# Patient Record
Sex: Male | Born: 2005
Health system: Southern US, Community
[De-identification: ages and names within clinical notes are randomized; demographics above are authoritative.]

## PROBLEM LIST (undated history)

## (undated) DIAGNOSIS — R569 Unspecified convulsions: Secondary | ICD-10-CM

## (undated) HISTORY — PX: CIRCUMCISION: SUR203

## (undated) HISTORY — DX: Unspecified convulsions: R56.9

---
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
## 2023-01-18 ENCOUNTER — Telehealth (INDEPENDENT_AMBULATORY_CARE_PROVIDER_SITE_OTHER): Payer: Self-pay | Admitting: Family

## 2023-01-18 NOTE — Telephone Encounter (Signed)
Patient not seen since 07/2021 and there are not any medications listed. Requested Meagan call and sched him for an appt.

## 2023-01-18 NOTE — Telephone Encounter (Signed)
  Name of who is calling: Lenise Arena Relationship to Patient: mom  Best contact number: [phone]  Provider they see: Rockwell Germany  Reason for call: Mom is calling saying the emergency medicine he had for seizures has expired. Do they even need to carry that around?   She is also sending over guardianship documentation.

## 2023-01-20 NOTE — Telephone Encounter (Signed)
I called and spoke with Colletta Maryland to get an appt scheduled for Roberto Williams,but she is requesting to speak with Otila Kluver regarding expired medication that she has on hand before an appt is made. She has requested a callback.

## 2023-01-20 NOTE — Telephone Encounter (Signed)
I called and talked with Mom. She said that the Midazolam has expired and that Acey hasn't had a seizure in 8 years. I told Mom that it is ok to not refill the medication unless she wants to keep it on hand. She prefers not to have it since he has been seizure free for so long. TG

## 2023-01-20 NOTE — Telephone Encounter (Signed)
Roberto Williams has called back in to follow up on emergency med. She has requested a call back  Guardianship papers are being resent to email.  FYI: I will be on the lookout for papers

## 2023-05-10 DIAGNOSIS — Z0000 Encounter for general adult medical examination without abnormal findings: Secondary | ICD-10-CM | POA: Diagnosis not present

## 2023-05-10 DIAGNOSIS — Z68.41 Body mass index (BMI) pediatric, 85th percentile to less than 95th percentile for age: Secondary | ICD-10-CM | POA: Diagnosis not present

## 2023-05-10 DIAGNOSIS — Z713 Dietary counseling and surveillance: Secondary | ICD-10-CM | POA: Diagnosis not present

## 2023-05-10 DIAGNOSIS — Z0001 Encounter for general adult medical examination with abnormal findings: Secondary | ICD-10-CM | POA: Diagnosis not present

## 2023-05-10 DIAGNOSIS — Z1331 Encounter for screening for depression: Secondary | ICD-10-CM | POA: Diagnosis not present

## 2023-06-08 DIAGNOSIS — M24531 Contracture, right wrist: Secondary | ICD-10-CM | POA: Diagnosis not present

## 2023-06-08 DIAGNOSIS — Z981 Arthrodesis status: Secondary | ICD-10-CM | POA: Diagnosis not present

## 2023-07-08 DIAGNOSIS — H468 Other optic neuritis: Secondary | ICD-10-CM | POA: Diagnosis not present

## 2023-07-08 DIAGNOSIS — H5509 Other forms of nystagmus: Secondary | ICD-10-CM | POA: Diagnosis not present

## 2023-07-08 DIAGNOSIS — H538 Other visual disturbances: Secondary | ICD-10-CM | POA: Diagnosis not present

## 2023-09-13 DIAGNOSIS — Q256 Stenosis of pulmonary artery: Secondary | ICD-10-CM | POA: Diagnosis not present

## 2023-09-13 DIAGNOSIS — Q203 Discordant ventriculoarterial connection: Secondary | ICD-10-CM | POA: Diagnosis not present

## 2023-11-02 DIAGNOSIS — G808 Other cerebral palsy: Secondary | ICD-10-CM | POA: Diagnosis not present

## 2023-11-26 DIAGNOSIS — J069 Acute upper respiratory infection, unspecified: Secondary | ICD-10-CM | POA: Diagnosis not present

## 2023-11-26 DIAGNOSIS — Z6828 Body mass index (BMI) 28.0-28.9, adult: Secondary | ICD-10-CM | POA: Diagnosis not present

## 2023-11-26 DIAGNOSIS — U071 COVID-19: Secondary | ICD-10-CM | POA: Diagnosis not present

## 2023-11-26 DIAGNOSIS — R509 Fever, unspecified: Secondary | ICD-10-CM | POA: Diagnosis not present

## 2023-12-02 DIAGNOSIS — Z6828 Body mass index (BMI) 28.0-28.9, adult: Secondary | ICD-10-CM | POA: Diagnosis not present

## 2023-12-02 DIAGNOSIS — U071 COVID-19: Secondary | ICD-10-CM | POA: Diagnosis not present

## 2023-12-02 DIAGNOSIS — J069 Acute upper respiratory infection, unspecified: Secondary | ICD-10-CM | POA: Diagnosis not present

## 2023-12-02 DIAGNOSIS — R030 Elevated blood-pressure reading, without diagnosis of hypertension: Secondary | ICD-10-CM | POA: Diagnosis not present

## 2024-05-20 ENCOUNTER — Emergency Department (HOSPITAL_COMMUNITY)

## 2024-05-20 ENCOUNTER — Other Ambulatory Visit: Payer: Self-pay

## 2024-05-20 ENCOUNTER — Emergency Department (HOSPITAL_COMMUNITY)
Admission: EM | Admit: 2024-05-20 | Discharge: 2024-05-20 | Disposition: A | Discharge status: Home / Self Care | Attending: Student | Admitting: Student

## 2024-05-20 ENCOUNTER — Encounter (HOSPITAL_COMMUNITY): Payer: Self-pay

## 2024-05-20 DIAGNOSIS — X58XXXA Exposure to other specified factors, initial encounter: Secondary | ICD-10-CM | POA: Diagnosis not present

## 2024-05-20 DIAGNOSIS — I10 Essential (primary) hypertension: Secondary | ICD-10-CM | POA: Diagnosis not present

## 2024-05-20 DIAGNOSIS — S0003XA Contusion of scalp, initial encounter: Secondary | ICD-10-CM | POA: Insufficient documentation

## 2024-05-20 DIAGNOSIS — G9389 Other specified disorders of brain: Secondary | ICD-10-CM | POA: Diagnosis not present

## 2024-05-20 DIAGNOSIS — G40209 Localization-related (focal) (partial) symptomatic epilepsy and epileptic syndromes with complex partial seizures, not intractable, without status epilepticus: Secondary | ICD-10-CM | POA: Diagnosis not present

## 2024-05-20 DIAGNOSIS — S0990XA Unspecified injury of head, initial encounter: Secondary | ICD-10-CM | POA: Diagnosis not present

## 2024-05-20 DIAGNOSIS — G40909 Epilepsy, unspecified, not intractable, without status epilepticus: Secondary | ICD-10-CM | POA: Insufficient documentation

## 2024-05-20 DIAGNOSIS — R569 Unspecified convulsions: Secondary | ICD-10-CM

## 2024-05-20 DIAGNOSIS — G4489 Other headache syndrome: Secondary | ICD-10-CM | POA: Diagnosis not present

## 2024-05-20 LAB — URINALYSIS, ROUTINE W REFLEX MICROSCOPIC
Bilirubin Urine: NEGATIVE
Glucose, UA: NEGATIVE mg/dL
Hgb urine dipstick: NEGATIVE
Ketones, ur: NEGATIVE mg/dL
Leukocytes,Ua: NEGATIVE
Nitrite: NEGATIVE
Protein, ur: >=300 mg/dL — AB
Specific Gravity, Urine: 1.016 (ref 1.005–1.030)
pH: 6 (ref 5.0–8.0)

## 2024-05-20 LAB — CBC WITH DIFFERENTIAL/PLATELET
Abs Immature Granulocytes: 0.01 10*3/uL (ref 0.00–0.07)
Basophils Absolute: 0.1 10*3/uL (ref 0.0–0.1)
Basophils Relative: 1 %
Eosinophils Absolute: 0.1 10*3/uL (ref 0.0–0.5)
Eosinophils Relative: 3 %
HCT: 45.7 % (ref 39.0–52.0)
Hemoglobin: 16 g/dL (ref 13.0–17.0)
Immature Granulocytes: 0 %
Lymphocytes Relative: 22 %
Lymphs Abs: 1.2 10*3/uL (ref 0.7–4.0)
MCH: 29.7 pg (ref 26.0–34.0)
MCHC: 35 g/dL (ref 30.0–36.0)
MCV: 84.9 fL (ref 80.0–100.0)
Monocytes Absolute: 0.5 10*3/uL (ref 0.1–1.0)
Monocytes Relative: 9 %
Neutro Abs: 3.6 10*3/uL (ref 1.7–7.7)
Neutrophils Relative %: 65 %
Platelets: 191 10*3/uL (ref 150–400)
RBC: 5.38 MIL/uL (ref 4.22–5.81)
RDW: 12.2 % (ref 11.5–15.5)
WBC: 5.5 10*3/uL (ref 4.0–10.5)
nRBC: 0 % (ref 0.0–0.2)

## 2024-05-20 LAB — RAPID URINE DRUG SCREEN, HOSP PERFORMED
Amphetamines: NOT DETECTED
Barbiturates: NOT DETECTED
Benzodiazepines: NOT DETECTED
Cocaine: NOT DETECTED
Opiates: NOT DETECTED
Tetrahydrocannabinol: NOT DETECTED

## 2024-05-20 LAB — COMPREHENSIVE METABOLIC PANEL WITH GFR
ALT: 34 U/L (ref 0–44)
AST: 25 U/L (ref 15–41)
Albumin: 4 g/dL (ref 3.5–5.0)
Alkaline Phosphatase: 64 U/L (ref 38–126)
Anion gap: 6 (ref 5–15)
BUN: 15 mg/dL (ref 6–20)
CO2: 28 mmol/L (ref 22–32)
Calcium: 9.3 mg/dL (ref 8.9–10.3)
Chloride: 106 mmol/L (ref 98–111)
Creatinine, Ser: 1.3 mg/dL — ABNORMAL HIGH (ref 0.61–1.24)
GFR, Estimated: >60 mL/min (ref >60)
Glucose, Bld: 121 mg/dL — ABNORMAL HIGH (ref 70–99)
Potassium: 3.9 mmol/L (ref 3.5–5.1)
Sodium: 140 mmol/L (ref 135–145)
Total Bilirubin: 0.7 mg/dL (ref 0.0–1.2)
Total Protein: 6.6 g/dL (ref 6.5–8.1)

## 2024-05-20 MED ORDER — LACTATED RINGERS IV BOLUS: 1000.0000 mL | Freq: Once | INTRAVENOUS | Status: DC

## 2024-05-20 MED ORDER — PROCHLORPERAZINE EDISYLATE 10 MG/2ML IJ SOLN: 10.0000 mg | Freq: Once | INTRAMUSCULAR | Status: DC

## 2024-05-20 MED ORDER — LEVETIRACETAM 500 MG PO TABS: 500.0000 mg | ORAL_TABLET | Freq: Two times a day (BID) | ORAL | 0 refills | Status: DC

## 2024-05-20 MED ORDER — ACETAMINOPHEN 500 MG PO TABS
1000.0000 mg | ORAL_TABLET | Freq: Once | ORAL | Status: AC
  Administered 2024-05-20: 1000 mg via ORAL
  Filled 2024-05-20: qty 2

## 2024-05-20 MED ORDER — ONDANSETRON HCL 4 MG/2ML IJ SOLN
INTRAMUSCULAR | Status: AC
  Filled 2024-05-20: qty 2

## 2024-05-20 MED ORDER — LEVETIRACETAM 500 MG PO TABS
500.0000 mg | ORAL_TABLET | Freq: Once | ORAL | Status: AC
  Administered 2024-05-20: 500 mg via ORAL
  Filled 2024-05-20: qty 1

## 2024-05-20 MED ORDER — DIPHENHYDRAMINE HCL 50 MG/ML IJ SOLN: 25.0000 mg | Freq: Once | INTRAMUSCULAR | Status: DC

## 2024-05-20 MED ORDER — ONDANSETRON HCL 4 MG/2ML IJ SOLN
4.0000 mg | Freq: Once | INTRAMUSCULAR | Status: AC
  Administered 2024-05-20: 4 mg via INTRAVENOUS
  Filled 2024-05-20: qty 2

## 2024-05-20 NOTE — ED Notes (Signed)
 Pt vomited after returning from ct  Pt changed into a gown and bed is cleaned

## 2024-05-20 NOTE — ED Triage Notes (Signed)
 Pt BIB [facility] from home after a witnessed grand mal seizure lasting about 3 minutes. Pt has remote hx seizures but none in the past 9 years. Hematoma to posterior scalp.  120/70 88HR 98% 14RR 112cbg 20g LAC

## 2024-05-20 NOTE — ED Provider Notes (Signed)
 [place] EMERGENCY DEPARTMENT AT [facility] Provider Note  CSN: [id number] Arrival date & time: 05/20/24 1649  Chief Complaint(s) Seizures  HPI Roberto Williams is a 18 y.o. 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  Patient was working out on the elliptical in the garage when his mother heard a crash.  Found the patient on the ground with involuntary contractures of the right upper extremity and a right sided gaze which is typical for his normal seizure pattern previously.  Return to normal mental status baseline within 5 minutes and no abortive medications used.  Here in the emergency room, he is asymptomatic denying any chest pain, shortness of breath, abdominal pain, nausea, vomiting, numbness, tingling, weakness outside of normal.   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 Authorizing Provider  levETIRAcetam (KEPPRA) 500 MG tablet Take 1 tablet (500 mg total) by mouth 2 (two) times daily. 05/20/24 06/19/24 Yes Kyngston Pickelsimer,  Coby Shrewsberry, MD                                                                                                                                    Past Surgical History Past Surgical History:  Procedure Laterality Date   CIRCUMCISION  2005   OTHER SURGICAL HISTORY  04/07/11   Right Heel Cord Surgery   OTHER SURGICAL HISTORY  03/14/04   Repair of transposition of the great arteries 2005,Right Heel Cord Surgery April 07, 2011   Family History Family History  Problem Relation Age of Onset   Heart attack Maternal Grandfather    Diabetes Paternal Grandmother     Social History Social History   Tobacco Use   Smoking status: Never   Smokeless tobacco: Never  Substance Use Topics   Alcohol use: Not Currently   Drug use: Never   Allergies Patient has no known allergies.  Review of Systems Review of Systems  Neurological:  Positive for seizures.    Physical Exam Vital Signs  I have reviewed the triage vital signs BP 128/70   Pulse 82  Temp 98 F (36.7 C) (Oral)   Resp 15   Ht 5\' 11"  (1.803 m)   Wt 77.1 kg   SpO2 100%   BMI 23.71 kg/m   Physical Exam Constitutional:      General: He is not in acute distress.    Appearance: Normal appearance.  HENT:     Head: Normocephalic.     Comments: Small posterior scalp hematoma    Nose: No congestion or rhinorrhea.  Eyes:     General:        Right eye: No discharge.        Left eye: No discharge.     Extraocular Movements: Extraocular movements intact.     Pupils: Pupils are equal, round, and reactive to light.  Cardiovascular:     Rate and Rhythm: Normal rate and regular rhythm.     Heart sounds: Murmur heard.  Pulmonary:     Effort: No respiratory distress.     Breath sounds: No wheezing or rales.  Abdominal:     General: There is no distension.     Tenderness: There is no abdominal tenderness.  Musculoskeletal:        General: Normal range of motion.     Cervical back: Normal range of motion.  Skin:    General:  Skin is warm and dry.  Neurological:     General: No focal deficit present.     Mental Status: He is alert. Mental status is at baseline.     Motor: Weakness present.     ED Results and Treatments Labs (all labs ordered are listed, but only abnormal results are displayed) Labs Reviewed  COMPREHENSIVE METABOLIC PANEL WITH GFR - Abnormal; Notable for the following components:      Result Value   Glucose, Bld 121 (*)    Creatinine, Ser 1.30 (*)    All other components within normal limits  URINALYSIS, ROUTINE W REFLEX MICROSCOPIC - Abnormal; Notable for the following components:   Protein, ur >=300 (*)    Bacteria, UA RARE (*)    All other components within normal limits  CBC WITH DIFFERENTIAL/PLATELET  RAPID URINE DRUG SCREEN, HOSP PERFORMED                                                                                                                          Radiology CT Head Wo Contrast Result Date: 05/20/2024 CLINICAL DATA:  Seizure disorder EXAM: CT HEAD WITHOUT CONTRAST TECHNIQUE: Contiguous axial images were obtained from the base of the skull through the vertex without intravenous contrast. RADIATION DOSE REDUCTION: This exam was performed according to the departmental dose-optimization program which includes automated exposure control, adjustment of the mA and/or kV according to patient size and/or use of iterative reconstruction technique. COMPARISON:  03/06/2007 FINDINGS: Brain: Stable left frontotemporal encephalomalacia, with ex vacuo dilatation of the left lateral ventricle. No evidence of acute infarct or hemorrhage. Lateral ventricles and midline structures are otherwise unremarkable. No acute extra-axial fluid collections.  No mass effect. Vascular: No hyperdense vessel or unexpected calcification. Skull: Normal. Negative for fracture or focal lesion. Sinuses/Orbits: No acute finding. Other: None. IMPRESSION: 1. Stable head CT, no acute intracranial process. Electronically  Signed   By: Bobbye Burrow M.D.   On: 05/20/2024 18:04    Pertinent labs & imaging results that were available during my care of the patient were reviewed by me and considered in my medical decision making (see MDM for details).  Medications Ordered in ED Medications  levETIRAcetam (KEPPRA) tablet 500 mg (has no administration in time range)  ondansetron  (ZOFRAN ) injection 4 mg (4 mg Intravenous Given 05/20/24 1808)  acetaminophen (TYLENOL) tablet 1,000 mg (1,000 mg Oral Given 05/20/24 1844)                                                                                                                                     Procedures Procedures  (including critical care time)  Medical Decision Making / ED Course   This patient presents to the ED for concern of seizure, this involves an extensive number of treatment options, and is a complaint that carries with it a high risk of complications and morbidity.  The differential diagnosis includes medication noncompliance, meningitis, posterior reversible encephalopathy syndrome, hyponatremia, convulsive syncope, focal lesion/mass, head trauma, intracerebral hemorrhage, toxins/recreational drugs, pseudoseizure  MDM: Patient seen the emergency room for evaluation of a seizure.  Physical exam with baseline right upper and right lower extremity weakness but no new neurologic findings today.  No cranial nerve deficits.  Laboratory evaluation with a creatinine of 1.3 with unknown baseline as last creatinine available is 1 from 2019.  Urinalysis with proteinuria but is otherwise unremarkable with no evidence of infection.  UDS negative.  CT head reassuringly stable with no acute findings.  Patient has returned to normal mental status baseline and throughout his time in the emergency department did not have any change in mental status or repeat seizure activity.  He did have a headache with 1 episode of vomiting and symptoms improved after Zofran  and  Tylenol.  I spoke with the neurologist on-call Dr. Cleone Dad who is recommending reinitiating AEDs with outpatient neurology follow-up and no need for MRI in the ER today as seizure morphology was identical to previous seizures.  Patient was previously on Lamictal  and given timeframe to reach therapeutic levels, neurology recommending Keppra initiation today.  Patient will be started on Keppra 500 twice daily and I placed an outpatient referral to [facility] neurology.  At this time he does not meet inpatient criteria for admission and will be discharged with outpatient follow-up.   Additional history obtained: -Additional history obtained from mother -External records from outside source obtained and reviewed including: Chart review including previous notes, labs, imaging, consultation notes   Lab Tests: -I ordered, reviewed, and interpreted labs.   The pertinent results include:   Labs Reviewed  COMPREHENSIVE METABOLIC PANEL WITH GFR -  Abnormal; Notable for the following components:      Result Value   Glucose, Bld 121 (*)    Creatinine, Ser 1.30 (*)    All other components within normal limits  URINALYSIS, ROUTINE W REFLEX MICROSCOPIC - Abnormal; Notable for the following components:   Protein, ur >=300 (*)    Bacteria, UA RARE (*)    All other components within normal limits  CBC WITH DIFFERENTIAL/PLATELET  RAPID URINE DRUG SCREEN, HOSP PERFORMED        Imaging Studies ordered: I ordered imaging studies including CT head I independently visualized and interpreted imaging. I agree with the radiologist interpretation   Medicines ordered and prescription drug management: Meds ordered this encounter  Medications   ondansetron  (ZOFRAN ) injection 4 mg   DISCONTD: prochlorperazine (COMPAZINE) injection 10 mg   DISCONTD: diphenhydrAMINE (BENADRYL) injection 25 mg   DISCONTD: lactated ringers bolus 1,000 mL   acetaminophen (TYLENOL) tablet 1,000 mg   DISCONTD: ondansetron  (ZOFRAN ) 4  MG/2ML injection    Martina Sledge, Dylan L: cabinet override   levETIRAcetam (KEPPRA) tablet 500 mg   levETIRAcetam (KEPPRA) 500 MG tablet    Sig: Take 1 tablet (500 mg total) by mouth 2 (two) times daily.    Dispense:  60 tablet    Refill:  0    -I have reviewed the patients home medicines and have made adjustments as needed  Critical interventions none  Consultations Obtained: I requested consultation with the neurologist on-call,  and discussed lab and imaging findings as well as pertinent plan - they recommend: Keppra, outpatient follow-up   Cardiac Monitoring: The patient was maintained on a cardiac monitor.  I personally viewed and interpreted the cardiac monitored which showed an underlying rhythm of: NSR  Social Determinants of Health:  Factors impacting patients care include: none   Reevaluation: After the interventions noted above, I reevaluated the patient and found that they have :improved  Co morbidities that complicate the patient evaluation  Past Medical History:  Diagnosis Date   Seizures (HCC)       Dispostion: I considered admission for this patient, but at this time he does not meet inpatient criteria for admission will be discharged with outpatient follow-up     Final Clinical Impression(s) / ED Diagnoses Final diagnoses:  Seizure [facility])     @PCDICTATION @    Karlyn Overman, MD 05/20/24 [id number]

## 2024-05-20 NOTE — ED Notes (Signed)
Seizure pads placed on both bed rails ?

## 2024-05-20 NOTE — Progress Notes (Addendum)
 Discussed with Dr. 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 Darlys Eland).  He was very well-controlled on lamotrigine .  Eventually this was tapered as he was not having any seizures, and he continued to be seizure-free for multiple years until today  CT head is reassuring without acute intracranial process and patient is back to his baseline per ED provider  I recommend reinitiation of lamotrigine .  Given that this takes several months to reach therapeutic level, I also recommend bridge with Keppra 500 mg twice daily for 2 months  Lamotrigine  titration schedule:   Morning  Night  Week 1 and 2   none   25 mg   Week 3 and 4   none   50 mg  Week 5   25 mg   50 mg  Week 6   50 mg   75 mg  Week 7   75 mg 100 mg  Week 8  100 mg 125 mg   Alternatively, Keppra monotherapy is also reasonable   Outpatient referral to [facility] is appropriate  Baldwin Levee MD-PhD [facility] [phone] Available 7 AM to 7 PM, outside these hours please contact Neurologist on call listed on AMION  11 minutes spent in care of the patient, majority in discussion with Dr. Jeannie Milo

## 2024-05-24 ENCOUNTER — Encounter: Payer: Self-pay | Admitting: Neurology

## 2024-05-24 ENCOUNTER — Ambulatory Visit (INDEPENDENT_AMBULATORY_CARE_PROVIDER_SITE_OTHER): Admitting: Neurology

## 2024-05-24 VITALS — BP 129/84 | HR 72 | Ht 71.0 in | Wt 195.0 lb

## 2024-05-24 DIAGNOSIS — G40209 Localization-related (focal) (partial) symptomatic epilepsy and epileptic syndromes with complex partial seizures, not intractable, without status epilepticus: Secondary | ICD-10-CM

## 2024-05-24 MED ORDER — VALTOCO 15 MG DOSE 2 X 7.5 MG/0.1ML NA LQPK: 15.0000 mg | NASAL | 0 refills | Status: AC | PRN

## 2024-05-24 MED ORDER — LEVETIRACETAM 500 MG PO TABS: 500.0000 mg | ORAL_TABLET | Freq: Two times a day (BID) | ORAL | 3 refills | Status: DC

## 2024-05-24 NOTE — Patient Instructions (Signed)
 Continue with Keppra 500 mg twice daily, refill given Will give patient Valtoco  as rescue medication Will check Keppra level today Routine EEG Follow-up in 6 months or sooner if worse.

## 2024-05-24 NOTE — Progress Notes (Signed)
 [facility]  PATIENT: Roberto Williams DOB: [birth year]  REQUESTING CLINICIAN: Kommor, Madison, MD HISTORY FROM: Patient/Mother and chart review  REASON FOR VISIT: Seizure disorder    HISTORICAL  CHIEF COMPLAINT:  Chief Complaint  Patient presents with   New Patient (Initial Visit)    Rm 13. NP internal referral for Seizure. Pt reports having seizures 20 years old - around 18 years old. Had his first seizure in 9 years last Saturday. Has not been taking meds for sz in 7 years, until after sz on Saturday. Pt reports working out right before sz on Saturday, but reports working out every day so does not think that is the trigger.      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 In term of the seizures, they started at the age of 1, described as generalized seizure with head and eyes deviation to the right and contraction of the right arm.  He was initially on carbamazepine  but switched to lamotrigine .  At the age of 26 he stopped having seizure but was still on medication for another 2 years.  At the age of 34 medication was discontinued after his EEG was normal. He has been off medication since then, for total of 7 years but on May 24, he did have a breakthrough seizure while working out in the garage, this was a his typical seizure, there was no injury, no tongue biting and no urinary incontinence.  Patient presented to the hospital and started on Keppra 500 mg twice daily.  He has been taking the Keppra for the past 3 to 4 days, denies any side effect from the medication.  Currently he is back to his normal self.   Handedness: left handed   Onset: At age 58  Seizure Type: Focal to bilateral tonic clonic  seizures  Current frequency: Last seizure 5/24, prior to that was 9 years ago   Any injuries from seizures: Denies   Seizure risk factors: Stroke at that age of 37 days old during  cardiac procedure   Previous ASMs: Carbamazepine /Lamotrigine    Currenty ASMs: Levetiracetam 500 mg twice daily   ASMs side effects: Denies   Brain Images: Left frontotemporal encephalomalacia   Previous EEGs: Last EEG reported normal    OTHER MEDICAL CONDITIONS: Left hemispheric stroke with right hemiplegia, focal epilepsy  REVIEW OF SYSTEMS: Full 14 system review of systems performed and negative with exception of: As noted in the HPI   ALLERGIES: No Known Allergies  HOME MEDICATIONS: Outpatient Medications Prior to Visit  Medication Sig Dispense Refill   levETIRAcetam (KEPPRA) 500 MG tablet Take 1 tablet (500 mg total) by mouth 2 (two) times daily. 60 tablet 0   No facility-administered medications prior to visit.    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 2005,Right Heel Cord Surgery April 07, 2011    FAMILY HISTORY: Family History  Problem Relation Age of  Onset   Heart attack Maternal Grandfather    Diabetes Paternal Grandmother     SOCIAL HISTORY: Social History   Socioeconomic History   Marital status: Single    Spouse name: Not on file   Number of children: Not on file   Years of education: Not on file   Highest education level: Not on file  Occupational History   Not on file  Tobacco Use   Smoking status: Never   Smokeless tobacco: Never  Substance and Sexual Activity   Alcohol use: Not Currently   Drug use: Never   Sexual activity: Not on file  Other Topics Concern   Not on file   Social History Narrative   [name] is a 12th grade student   He is home schooled this year.    He lives with his parents and two siblings.    He enjoys playing baseball for the [employer] and playing outside.     Social Drivers of Corporate investment banker Strain: Not on file  Food Insecurity: Not on file  Transportation Needs: Not on file  Physical Activity: Not on file  Stress: Not on file  Social Connections: Not on file  Intimate Partner Violence: Not on file    PHYSICAL EXAM  GENERAL EXAM/CONSTITUTIONAL: Vitals:  Vitals:   05/24/24 1107  BP: 129/84  Pulse: 72  Weight: 195 lb (88.5 kg)  Height: 5\' 11"  (1.803 m)   Body mass index is 27.2 kg/m. Wt Readings from Last 3 Encounters:  05/24/24 195 lb (88.5 kg)  05/20/24 170 lb (77.1 kg)  08/14/21 190 lb 9.6 oz (86.5 kg) (93%, Z= 1.46)*   * Growth percentiles are based on CDC (Boys, 2-20 Years) data.   Patient is in no distress; well developed, nourished and groomed; neck is supple  MUSCULOSKELETAL: Gait, strength, tone, movements noted in Neurologic exam below  NEUROLOGIC: MENTAL STATUS:      No data to display         awake, alert, oriented to person, place and time recent and remote memory intact normal attention and concentration language fluent, comprehension intact, naming intact fund of knowledge appropriate  CRANIAL NERVE:  2nd, 3rd, 4th, 6th - Visual fields full to confrontation, extraocular muscles intact, no nystagmus 5th - facial sensation symmetric 7th - facial strength symmetric 8th - hearing intact 9th - palate elevates symmetrically, uvula midline 11th - shoulder shrug symmetric 12th - tongue protrusion midline  MOTOR:  normal bulk and tone, full strength in the LUE and LLE. RUE and RLE 4 to 4+/5 except for 1 to 2 finger intrinsics. He does have a right AFO  SENSORY:  normal and symmetric to light touch  COORDINATION:  finger-nose-finger, fine finger movements normal on the  left   GAIT/STATION:  Hemiplegic gait    DIAGNOSTIC DATA (LABS, IMAGING, TESTING) - I reviewed patient records, labs, notes, testing and imaging myself where available.  Lab Results  Component Value Date   WBC 5.5 05/20/2024   HGB 16.0 05/20/2024   HCT 45.7 05/20/2024   MCV 84.9 05/20/2024   PLT 191 05/20/2024      Component Value Date/Time   NA 140 05/20/2024 1700   K 3.9 05/20/2024 1700   CL 106 05/20/2024 1700   CO2 28 05/20/2024 1700   GLUCOSE 121 (H) 05/20/2024 1700   BUN 15 05/20/2024 1700   CREATININE 1.30 (H) 05/20/2024 1700   CALCIUM 9.3 05/20/2024 1700   PROT 6.6 05/20/2024 1700   ALBUMIN  4.0 05/20/2024 1700   AST 25 05/20/2024 1700   ALT 34 05/20/2024 1700   ALKPHOS 64 05/20/2024 1700   BILITOT 0.7 05/20/2024 1700   GFRNONAA >60 05/20/2024 1700   No results found for: "CHOL", "HDL", "LDLCALC", "LDLDIRECT", "TRIG" No results found for: "HGBA1C" No results found for: "VITAMINB12" No results found for: "TSH"  Head CT 04/20/2024 Stable left frontotemporal encephalomalacia, with ex vacuo dilatation of the left lateral ventricle. No evidence of acute infarct or hemorrhage.  EEG 2017 This is a normal record with the patient awake and drowsy.  The infrequent asymmetry in background during drowsiness is of uncertain significance likely relates to the patient's underlying structural abnormality.  There is no clear-cut seizure activity except for the solitary lesion described above.  This is not epileptogenic from an electrographic viewpoint.    ASSESSMENT AND PLAN  18 y.o. year old male  with history of stroke at the age of 2 days old during a cardiac procedure, seizure disorder who is presenting for management of his epilepsy.  He did have a seizure on May 24 but prior to that his last seizure was 9 years ago.  He is back on antiseizure medication, Keppra  500 mg twice daily.  Plan will be for patient to continue Keppra  500 mg twice daily and will obtain a Keppra   level and a routine EEG.  Again family is interested in coming off medication but I did inform them that he will need to remain on medication and be seizure-free for few years before we can reduce the medication.  Advised him to contact me if patient experience any side effect from the Keppra .  I will see him in 6 months for follow-up or sooner if worse.   1. Partial symptomatic epilepsy with complex partial seizures, not intractable, without status epilepticus (HCC)     Patient Instructions  Continue with Keppra  500 mg twice daily, refill given Will give patient Valtoco  as rescue medication Will check Keppra  level today Routine EEG Follow-up in 6 months or sooner if worse.   Per [place] statutes, patients with seizures are not allowed to drive until they have been seizure-free for six months.  Other recommendations include using caution when using heavy equipment or power tools. Avoid working on ladders or at heights. Take showers instead of baths.  Do not swim alone.  Ensure the water temperature is not too high on the home water heater. Do not go swimming alone. Do not lock yourself in a room alone (i.e. bathroom). When caring for infants or small children, sit down when holding, feeding, or changing them to minimize risk of injury to the child in the event you have a seizure. Maintain good sleep hygiene. Avoid alcohol.  Also recommend adequate sleep, hydration, good diet and minimize stress.   During the Seizure  - First, ensure adequate ventilation and place patients on the floor on their left side  Loosen clothing around the neck and ensure the airway is patent. If the patient is clenching the teeth, do not force the mouth open with any object as this can cause severe damage - Remove all items from the surrounding that can be hazardous. The patient may be oblivious to what's happening and may not even know what he or she is doing. If the patient is confused and wandering,  either gently guide him/her away and block access to outside areas - Reassure the individual and be comforting - Call 911. In most cases, the seizure ends  before EMS arrives. However, there are cases when seizures may last over 3 to 5 minutes. Or the individual may have developed breathing difficulties or severe injuries. If a pregnant patient or a person with diabetes develops a seizure, it is prudent to call an ambulance. - Finally, if the patient does not regain full consciousness, then call EMS. Most patients will remain confused for about 45 to 90 minutes after a seizure, so you must use judgment in calling for help. - Avoid restraints but make sure the patient is in a bed with padded side rails - Place the individual in a lateral position with the neck slightly flexed; this will help the saliva drain from the mouth and prevent the tongue from falling backward - Remove all nearby furniture and other hazards from the area - Provide verbal assurance as the individual is regaining consciousness - Provide the patient with privacy if possible - Call for help and start treatment as ordered by the caregiver   After the Seizure (Postictal Stage)  After a seizure, most patients experience confusion, fatigue, muscle pain and/or a headache. Thus, one should permit the individual to sleep. For the next few days, reassurance is essential. Being calm and helping reorient the person is also of importance.  Most seizures are painless and end spontaneously. Seizures are not harmful to others but can lead to complications such as stress on the lungs, brain and the heart. Individuals with prior lung problems may develop labored breathing and respiratory distress.    Discussed Patients with epilepsy have a small risk of sudden unexpected death, a condition referred to as sudden unexpected death in epilepsy (SUDEP). SUDEP is defined specifically as the sudden, unexpected, witnessed or unwitnessed, nontraumatic and  nondrowning death in patients with epilepsy with or without evidence for a seizure, and excluding documented status epilepticus, in which post mortem examination does not reveal a structural or toxicologic cause for death     Orders Placed This Encounter  Procedures   Levetiracetam  level   EEG adult    Meds ordered this encounter  Medications   levETIRAcetam  (KEPPRA ) 500 MG tablet    Sig: Take 1 tablet (500 mg total) by mouth 2 (two) times daily.    Dispense:  180 tablet    Refill:  3   diazePAM , 15 MG Dose, (VALTOCO  15 MG DOSE) 2 x 7.5 MG/0.1ML LQPK    Sig: Place 15 mg into the nose as needed (for seizure lasting more than 2 minutes).    Dispense:  3 each    Refill:  0    Please provide 3 boxes for a total of 6 doses    Return in about 6 months (around 11/24/2024).    Cassandra Cleveland, MD 05/24/2024, 12:44 PM  [facility] [address] [place], [place] [address] [phone]

## 2024-05-26 LAB — LEVETIRACETAM LEVEL: Levetiracetam Lvl: 20.3 ug/mL (ref 10.0–40.0)

## 2024-05-29 ENCOUNTER — Ambulatory Visit: Payer: Self-pay | Admitting: Neurology

## 2024-05-31 ENCOUNTER — Ambulatory Visit (INDEPENDENT_AMBULATORY_CARE_PROVIDER_SITE_OTHER): Admitting: Neurology

## 2024-05-31 DIAGNOSIS — G40209 Localization-related (focal) (partial) symptomatic epilepsy and epileptic syndromes with complex partial seizures, not intractable, without status epilepticus: Secondary | ICD-10-CM

## 2024-05-31 NOTE — Procedures (Signed)
    History:  18 year old man with epilepsy  EEG classification: Awake and drowsy  Duration: 27 minutes   Technical aspects: This EEG study was done with scalp electrodes positioned according to the 10-20 International system of electrode placement. Electrical activity was reviewed with band pass filter of 1-70Hz , sensitivity of 7 uV/mm, display speed of 56mm/sec with a 60Hz  notched filter applied as appropriate. EEG data were recorded continuously and digitally stored.   Description of the recording: The background rhythms of this recording consists of a fairly well modulated medium amplitude alpha rhythm of 10 Hz that is reactive to eye opening and closure. Present in the anterior head region is a 15-20 Hz beta activity. Photic stimulation was performed, did not show any abnormalities. Hyperventilation was also performed, did not show any abnormalities. Drowsiness was not seen. No abnormal epileptiform discharges seen during this recording. There was left hemispheric slowing. There were no electrographic seizure identified.   Abnormality: Left hemispheric slowing   Impression: This is an abnormal awake EEG due to left hemispheric slowing. This is consistent with neuronal dysfunction in the left hemisphere. No seizures, or epileptiform discharges seen during this recording.       Markelle Asaro, MD [facility]

## 2024-06-12 ENCOUNTER — Telehealth: Payer: Self-pay | Admitting: Neurology

## 2024-06-12 MED ORDER — LEVETIRACETAM 500 MG PO TABS: 500.0000 mg | ORAL_TABLET | Freq: Two times a day (BID) | ORAL | 3 refills | Status: DC

## 2024-06-12 NOTE — Telephone Encounter (Signed)
 Pt mother called in regards to getting refill of medication ( levETIRAcetam  (KEPPRA ) 500 MG tablet). Pt has a week left of medication .  Pt mother would like medication sent to   [facility] [address] [place], [place] - [address] (Ph: [phone])

## 2024-06-12 NOTE — Telephone Encounter (Signed)
 refilled

## 2024-07-12 ENCOUNTER — Encounter: Payer: Self-pay | Admitting: Neurology

## 2024-07-12 NOTE — Telephone Encounter (Signed)
 Let me review the forms.

## 2024-07-13 DIAGNOSIS — H538 Other visual disturbances: Secondary | ICD-10-CM | POA: Diagnosis not present

## 2024-07-20 DIAGNOSIS — Z1329 Encounter for screening for other suspected endocrine disorder: Secondary | ICD-10-CM | POA: Diagnosis not present

## 2024-07-20 DIAGNOSIS — Z0001 Encounter for general adult medical examination with abnormal findings: Secondary | ICD-10-CM | POA: Diagnosis not present

## 2024-07-20 DIAGNOSIS — Z136 Encounter for screening for cardiovascular disorders: Secondary | ICD-10-CM | POA: Diagnosis not present

## 2024-07-20 DIAGNOSIS — Z131 Encounter for screening for diabetes mellitus: Secondary | ICD-10-CM | POA: Diagnosis not present

## 2024-08-02 DIAGNOSIS — G802 Spastic hemiplegic cerebral palsy: Secondary | ICD-10-CM | POA: Diagnosis not present

## 2024-08-03 DIAGNOSIS — D751 Secondary polycythemia: Secondary | ICD-10-CM | POA: Diagnosis not present

## 2024-08-03 DIAGNOSIS — E781 Pure hyperglyceridemia: Secondary | ICD-10-CM | POA: Diagnosis not present

## 2024-09-07 DIAGNOSIS — D751 Secondary polycythemia: Secondary | ICD-10-CM | POA: Diagnosis not present

## 2024-09-07 DIAGNOSIS — E781 Pure hyperglyceridemia: Secondary | ICD-10-CM | POA: Diagnosis not present

## 2024-09-14 DIAGNOSIS — R569 Unspecified convulsions: Secondary | ICD-10-CM | POA: Diagnosis not present

## 2024-09-14 DIAGNOSIS — G43009 Migraine without aura, not intractable, without status migrainosus: Secondary | ICD-10-CM | POA: Diagnosis not present

## 2024-09-14 DIAGNOSIS — R809 Proteinuria, unspecified: Secondary | ICD-10-CM | POA: Diagnosis not present

## 2024-09-14 DIAGNOSIS — D751 Secondary polycythemia: Secondary | ICD-10-CM | POA: Diagnosis not present

## 2024-11-21 DIAGNOSIS — G40909 Epilepsy, unspecified, not intractable, without status epilepticus: Secondary | ICD-10-CM | POA: Diagnosis not present

## 2024-11-21 DIAGNOSIS — J029 Acute pharyngitis, unspecified: Secondary | ICD-10-CM | POA: Diagnosis not present

## 2024-11-21 DIAGNOSIS — R509 Fever, unspecified: Secondary | ICD-10-CM | POA: Diagnosis not present

## 2024-12-12 ENCOUNTER — Encounter: Payer: Self-pay | Admitting: Neurology

## 2024-12-12 ENCOUNTER — Ambulatory Visit: Admitting: Neurology

## 2024-12-12 VITALS — HR 67 | Temp 97.9°F | Ht 71.0 in | Wt 181.0 lb

## 2024-12-12 DIAGNOSIS — G40209 Localization-related (focal) (partial) symptomatic epilepsy and epileptic syndromes with complex partial seizures, not intractable, without status epilepticus: Secondary | ICD-10-CM

## 2024-12-12 MED ORDER — LEVETIRACETAM 500 MG PO TABS: 500.0000 mg | ORAL_TABLET | Freq: Two times a day (BID) | ORAL | 4 refills | Status: AC

## 2024-12-12 NOTE — Patient Instructions (Addendum)
 Continue with Keppra  500 mg twice daily At next visit, if no additional seizures, we will reduce it to 50 mg twice daily Continue to follow with your doctor Return in 1 year or sooner if worse.

## 2024-12-12 NOTE — Progress Notes (Signed)
 [facility]  PATIENT: Roberto Williams DOB: Jun 23, 2006  REQUESTING CLINICIAN: No ref. provider found HISTORY FROM: Patient/Mother and chart review  REASON FOR VISIT: Seizure disorder    HISTORICAL  CHIEF COMPLAINT:  Chief Complaint  Patient presents with   Follow-up    Follow-up seizures. Patient is in room number 13.    INTERVAL HISTORY 12/12/2024 Patient presents today, he is accompanied by mother.  Last visit was in June, at that time we continued his Keppra  500 mg twice daily, obtained a routine EEG which showed a continuous left temporal slowing.  He reports compliance with the medications, no side effects, and no additional seizures.  Since June, he did have 3 headaches lasting about 4 hours.  Usually does not take medication with his headaches.  No other complaints, no other concerns.  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 In term of the seizures, they started at the age of 1, described as generalized seizure with head and eyes deviation to the right and contraction of the right arm.  He was initially on carbamazepine  but switched to lamotrigine .  At the age of 18 he stopped having seizure but was still on medication for another 2 years.  At the age of 18 medication was discontinued after his EEG was normal. He has been off medication since then, for total of 7 years but on May 24, he did have a breakthrough seizure while working out in the garage, this was a his typical seizure, there was no injury, no tongue biting and no urinary incontinence.  Patient presented to the hospital and started on Keppra  500 mg twice daily.  He has been taking  the Keppra  for the past 3 to 4 days, denies any side effect from the medication.  Currently he is back to his normal self.   Handedness: left handed   Onset: At age 18  Seizure Type: Focal to bilateral tonic clonic seizures  Current frequency: Last seizure 5/24, prior to that was 9 years ago   Any injuries from seizures: Denies   Seizure risk factors: Stroke at that age of 20 days old during  cardiac procedure   Previous ASMs: Carbamazepine /Lamotrigine    Currenty ASMs: Levetiracetam  500 mg twice daily   ASMs side effects: Denies   Brain Images: Left frontotemporal encephalomalacia   Previous EEGs: Last EEG reported normal    OTHER MEDICAL CONDITIONS: Left hemispheric stroke with right hemiplegia, focal epilepsy  REVIEW OF SYSTEMS: Full 14 system review of systems performed and negative with exception of: As noted in the HPI   ALLERGIES: No Known Allergies  HOME MEDICATIONS: Outpatient Medications Prior to Visit  Medication Sig Dispense Refill   diazePAM , 15 MG Dose, (VALTOCO  15 MG DOSE) 2 x 7.5 MG/0.1ML LQPK Place 15 mg into the nose as needed (for seizure lasting more than 2 minutes). 3 each 0   levETIRAcetam  (KEPPRA ) 500 MG tablet Take 1 tablet (500 mg total) by mouth 2 (two) times daily. 180 tablet 3   No facility-administered medications prior to visit.    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 2005,Right Heel Cord Surgery April 07, 2011    FAMILY HISTORY: Family History  Problem Relation Age of Onset   Heart attack Maternal Grandfather    Diabetes Paternal Grandmother     SOCIAL HISTORY: Social History   Socioeconomic History   Marital status: Single    Spouse name: Not  on file   Number of children: Not on file   Years of education: Not on file   Highest education level: Not on file  Occupational History   Not on file  Tobacco Use   Smoking status: Never   Smokeless tobacco: Never  Substance and Sexual Activity   Alcohol use: Not Currently   Drug use: Never   Sexual activity: Not on file  Other Topics Concern   Not on file  Social History Narrative   Kalonji is a 12th grade student   He is home schooled this year.    He lives with his parents and two siblings.    He enjoys playing baseball for the [employer] and playing outside.     Social Drivers of Health   Tobacco Use: Low Risk (12/12/2024)   Patient History    Smoking Tobacco Use: Never    Smokeless Tobacco Use: Never    Passive Exposure: Not on file  Financial Resource Strain: Not on file  Food Insecurity: Not on file  Transportation Needs: Not on file  Physical Activity: Not on file  Stress: Not on file  Social Connections: Not on file  Intimate Partner Violence: Not on file  Depression (EYV7-0): Not on file  Alcohol Screen: Not on file  Housing: Not on file  Utilities: Not on file  Health Literacy: Not on file    PHYSICAL EXAM  GENERAL EXAM/CONSTITUTIONAL: Vitals:  Vitals:   12/12/24 1332  Pulse: 67  Temp: 97.9 F (36.6 C)  SpO2: 99%  Weight: 181 lb (82.1 kg)  Height: 5' 11 (1.803 m)   Body mass index is 25.24 kg/m. Wt Readings from Last 3 Encounters:  12/12/24 181 lb (82.1 kg)  05/24/24 195 lb (88.5 kg)  05/20/24 170 lb (77.1 kg)   Patient is in no distress; well developed, nourished and groomed; neck is supple  MUSCULOSKELETAL: Gait, strength, tone, movements noted in Neurologic exam below  NEUROLOGIC: MENTAL STATUS:      No data to display         awake, alert, oriented to person, place and time recent and remote memory intact normal attention and concentration language fluent, comprehension intact, naming intact fund of knowledge  appropriate  CRANIAL NERVE:  2nd, 3rd, 4th, 6th - Visual fields full to confrontation, extraocular muscles intact, no nystagmus 5th - facial sensation symmetric 7th - facial strength symmetric 8th - hearing intact 9th - palate elevates symmetrically, uvula midline 11th - shoulder shrug symmetric 12th - tongue protrusion midline  MOTOR:  normal bulk and tone, full strength in the LUE and LLE. RUE and RLE 4 to 4+/5 except for 1 to 2 finger intrinsics. He does have a right AFO  SENSORY:  normal and symmetric to light touch  COORDINATION:  finger-nose-finger, fine finger movements normal on the left   GAIT/STATION:  Hemiplegic gait    DIAGNOSTIC DATA (LABS, IMAGING, TESTING) -  I reviewed patient records, labs, notes, testing and imaging myself where available.  Lab Results  Component Value Date   WBC 5.5 05/20/2024   HGB 16.0 05/20/2024   HCT 45.7 05/20/2024   MCV 84.9 05/20/2024   PLT 191 05/20/2024      Component Value Date/Time   NA 140 05/20/2024 1700   K 3.9 05/20/2024 1700   CL 106 05/20/2024 1700   CO2 28 05/20/2024 1700   GLUCOSE 121 (H) 05/20/2024 1700   BUN 15 05/20/2024 1700   CREATININE 1.30 (H) 05/20/2024 1700   CALCIUM 9.3 05/20/2024 1700   PROT 6.6 05/20/2024 1700   ALBUMIN 4.0 05/20/2024 1700   AST 25 05/20/2024 1700   ALT 34 05/20/2024 1700   ALKPHOS 64 05/20/2024 1700   BILITOT 0.7 05/20/2024 1700   GFRNONAA >60 05/20/2024 1700   No results found for: CHOL, HDL, LDLCALC, LDLDIRECT, TRIG No results found for: HGBA1C No results found for: VITAMINB12 No results found for: TSH  Head CT 04/20/2024 Stable left frontotemporal encephalomalacia, with ex vacuo dilatation of the left lateral ventricle. No evidence of acute infarct or hemorrhage.  EEG 2017 This is a normal record with the patient awake and drowsy.  The infrequent asymmetry in background during drowsiness is of uncertain significance likely relates to the patient's  underlying structural abnormality.  There is no clear-cut seizure activity except for the solitary lesion described above.  This is not epileptogenic from an electrographic viewpoint.   EEG 05/31/2024 This is an abnormal awake EEG due to left hemispheric slowing. This is consistent with neuronal dysfunction in the left hemisphere. No seizures, or epileptiform discharges seen during this recording.    ASSESSMENT AND PLAN  18 y.o. year old male  with history of stroke at the age of 37 days old during a cardiac procedure, seizure disorder who is presenting for for follow-up.  His last seizure was on May 24, since then his Keppra  has been restarted, tolerating the medication very well.  He is alert and EEG show continuous left temporal region slowing.  Plan will be to continue with Keppra  500 mg twice daily and if no additional seizures, at next visit in a year we will further decrease it to 50 mg twice daily.  They understand to contact me if he does have any breakthrough seizure or any other questions.   1. Partial symptomatic epilepsy with complex partial seizures, not intractable, without status epilepticus (HCC)      Patient Instructions  Continue with Keppra  500 mg twice daily At next visit, if no additional seizures, we will reduce it to 50 mg twice daily Continue to follow with your doctor Return in 1 year or sooner if worse.   Per [place] statutes, patients with seizures are not allowed to drive until they have been seizure-free for six months.  Other recommendations include using caution when using heavy equipment or power tools. Avoid working on ladders or at heights. Take showers instead of baths.  Do not swim alone.  Ensure the water temperature is not too high on the home water heater. Do not go swimming alone. Do not lock yourself in a room alone (i.e. bathroom). When caring for infants or small children, sit down when holding, feeding, or changing them to minimize risk of  injury to the child in the event you have a seizure. Maintain good sleep hygiene. Avoid alcohol.  Also recommend adequate sleep, hydration, good diet and minimize stress.   During the Seizure  -  First, ensure adequate ventilation and place patients on the floor on their left side  Loosen clothing around the neck and ensure the airway is patent. If the patient is clenching the teeth, do not force the mouth open with any object as this can cause severe damage - Remove all items from the surrounding that can be hazardous. The patient may be oblivious to what's happening and may not even know what he or she is doing. If the patient is confused and wandering, either gently guide him/her away and block access to outside areas - Reassure the individual and be comforting - Call 911. In most cases, the seizure ends before EMS arrives. However, there are cases when seizures may last over 3 to 5 minutes. Or the individual may have developed breathing difficulties or severe injuries. If a pregnant patient or a person with diabetes develops a seizure, it is prudent to call an ambulance. - Finally, if the patient does not regain full consciousness, then call EMS. Most patients will remain confused for about 45 to 90 minutes after a seizure, so you must use judgment in calling for help. - Avoid restraints but make sure the patient is in a bed with padded side rails - Place the individual in a lateral position with the neck slightly flexed; this will help the saliva drain from the mouth and prevent the tongue from falling backward - Remove all nearby furniture and other hazards from the area - Provide verbal assurance as the individual is regaining consciousness - Provide the patient with privacy if possible - Call for help and start treatment as ordered by the caregiver   After the Seizure (Postictal Stage)  After a seizure, most patients experience confusion, fatigue, muscle pain and/or a headache. Thus, one  should permit the individual to sleep. For the next few days, reassurance is essential. Being calm and helping reorient the person is also of importance.  Most seizures are painless and end spontaneously. Seizures are not harmful to others but can lead to complications such as stress on the lungs, brain and the heart. Individuals with prior lung problems may develop labored breathing and respiratory distress.    Discussed Patients with epilepsy have a small risk of sudden unexpected death, a condition referred to as sudden unexpected death in epilepsy (SUDEP). SUDEP is defined specifically as the sudden, unexpected, witnessed or unwitnessed, nontraumatic and nondrowning death in patients with epilepsy with or without evidence for a seizure, and excluding documented status epilepticus, in which post mortem examination does not reveal a structural or toxicologic cause for death     No orders of the defined types were placed in this encounter.   Meds ordered this encounter  Medications   levETIRAcetam  (KEPPRA ) 500 MG tablet    Sig: Take 1 tablet (500 mg total) by mouth 2 (two) times daily.    Dispense:  180 tablet    Refill:  4    Return in about 1 year (around 12/12/2025).    Pastor Falling, MD 12/12/2024, 2:08 PM  [facility] [address], [address] [place], [place] [address] [phone]

## 2025-12-13 ENCOUNTER — Ambulatory Visit: Admitting: Neurology
